# Patient Record
Sex: Male | Born: 1960 | Race: White | Hispanic: No | Marital: Married | Smoking: Current some day smoker
Health system: Southern US, Community
[De-identification: ages and names within clinical notes are randomized; demographics above are authoritative.]

## PROBLEM LIST (undated history)

## (undated) DIAGNOSIS — E119 Type 2 diabetes mellitus without complications: Secondary | ICD-10-CM

## (undated) DIAGNOSIS — I10 Essential (primary) hypertension: Secondary | ICD-10-CM

## (undated) HISTORY — PX: CARDIAC SURGERY: SHX584

---

## 2017-08-31 ENCOUNTER — Emergency Department (HOSPITAL_COMMUNITY): Payer: Worker's Compensation

## 2017-08-31 ENCOUNTER — Emergency Department (HOSPITAL_COMMUNITY)
Admission: EM | Admit: 2017-08-31 | Discharge: 2017-09-01 | Disposition: A | Discharge status: Home / Self Care | Payer: Worker's Compensation | Attending: Emergency Medicine | Admitting: Emergency Medicine

## 2017-08-31 ENCOUNTER — Encounter (HOSPITAL_COMMUNITY): Payer: Self-pay | Admitting: Emergency Medicine

## 2017-08-31 DIAGNOSIS — S62114A Nondisplaced fracture of triquetrum [cuneiform] bone, right wrist, initial encounter for closed fracture: Secondary | ICD-10-CM | POA: Diagnosis not present

## 2017-08-31 DIAGNOSIS — M7918 Myalgia, other site: Secondary | ICD-10-CM | POA: Insufficient documentation

## 2017-08-31 DIAGNOSIS — M25511 Pain in right shoulder: Secondary | ICD-10-CM

## 2017-08-31 DIAGNOSIS — J984 Other disorders of lung: Secondary | ICD-10-CM | POA: Diagnosis not present

## 2017-08-31 DIAGNOSIS — R51 Headache: Secondary | ICD-10-CM | POA: Insufficient documentation

## 2017-08-31 DIAGNOSIS — Y92410 Unspecified street and highway as the place of occurrence of the external cause: Secondary | ICD-10-CM | POA: Diagnosis not present

## 2017-08-31 DIAGNOSIS — Y9389 Activity, other specified: Secondary | ICD-10-CM | POA: Diagnosis not present

## 2017-08-31 DIAGNOSIS — I10 Essential (primary) hypertension: Secondary | ICD-10-CM | POA: Diagnosis not present

## 2017-08-31 DIAGNOSIS — K760 Fatty (change of) liver, not elsewhere classified: Secondary | ICD-10-CM | POA: Diagnosis not present

## 2017-08-31 DIAGNOSIS — S6991XA Unspecified injury of right wrist, hand and finger(s), initial encounter: Secondary | ICD-10-CM | POA: Diagnosis present

## 2017-08-31 DIAGNOSIS — M791 Myalgia, unspecified site: Secondary | ICD-10-CM

## 2017-08-31 DIAGNOSIS — E119 Type 2 diabetes mellitus without complications: Secondary | ICD-10-CM | POA: Diagnosis not present

## 2017-08-31 DIAGNOSIS — Y990 Civilian activity done for income or pay: Secondary | ICD-10-CM | POA: Insufficient documentation

## 2017-08-31 DIAGNOSIS — F17200 Nicotine dependence, unspecified, uncomplicated: Secondary | ICD-10-CM | POA: Diagnosis not present

## 2017-08-31 DIAGNOSIS — I2510 Atherosclerotic heart disease of native coronary artery without angina pectoris: Secondary | ICD-10-CM | POA: Insufficient documentation

## 2017-08-31 HISTORY — DX: Essential (primary) hypertension: I10

## 2017-08-31 HISTORY — DX: Type 2 diabetes mellitus without complications: E11.9

## 2017-08-31 LAB — CBC
HEMATOCRIT: 38.9 % — AB (ref 39.0–52.0)
Hemoglobin: 12.9 g/dL — ABNORMAL LOW (ref 13.0–17.0)
MCH: 29.4 pg (ref 26.0–34.0)
MCHC: 33.2 g/dL (ref 30.0–36.0)
MCV: 88.6 fL (ref 78.0–100.0)
Platelets: 141 10*3/uL — ABNORMAL LOW (ref 150–400)
RBC: 4.39 MIL/uL (ref 4.22–5.81)
RDW: 14.2 % (ref 11.5–15.5)
WBC: 5.1 10*3/uL (ref 4.0–10.5)

## 2017-08-31 LAB — I-STAT CHEM 8, ED
BUN: 6 mg/dL (ref 6–20)
CHLORIDE: 95 mmol/L — AB (ref 101–111)
CREATININE: 1.1 mg/dL (ref 0.61–1.24)
Calcium, Ion: 1.17 mmol/L (ref 1.15–1.40)
GLUCOSE: 100 mg/dL — AB (ref 65–99)
HCT: 41 % (ref 39.0–52.0)
Hemoglobin: 13.9 g/dL (ref 13.0–17.0)
POTASSIUM: 3.9 mmol/L (ref 3.5–5.1)
Sodium: 135 mmol/L (ref 135–145)
TCO2: 30 mmol/L (ref 22–32)

## 2017-08-31 MED ORDER — PROPOFOL 10 MG/ML IV BOLUS
INTRAVENOUS | Status: AC
  Filled 2017-08-31: qty 20

## 2017-08-31 MED ORDER — IOPAMIDOL (ISOVUE-300) INJECTION 61%
INTRAVENOUS | Status: AC
  Administered 2017-09-01: 100 mL
  Filled 2017-08-31: qty 100

## 2017-08-31 MED ORDER — ONDANSETRON HCL 4 MG/2ML IJ SOLN
4.0000 mg | Freq: Once | INTRAMUSCULAR | Status: AC
  Administered 2017-08-31: 4 mg via INTRAVENOUS
  Filled 2017-08-31: qty 2

## 2017-08-31 MED ORDER — MORPHINE SULFATE (PF) 4 MG/ML IV SOLN
4.0000 mg | Freq: Once | INTRAVENOUS | Status: AC
  Administered 2017-08-31: 4 mg via INTRAVENOUS
  Filled 2017-08-31: qty 1

## 2017-08-31 MED ORDER — PROPOFOL 10 MG/ML IV BOLUS: 0.5000 mg/kg | Freq: Once | INTRAVENOUS | Status: DC

## 2017-08-31 MED ORDER — HYDROMORPHONE HCL 1 MG/ML IJ SOLN
1.0000 mg | Freq: Once | INTRAMUSCULAR | Status: AC
  Administered 2017-08-31: 1 mg via INTRAVENOUS
  Filled 2017-08-31: qty 1
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
## 2017-09-01 ENCOUNTER — Emergency Department (HOSPITAL_COMMUNITY): Payer: Worker's Compensation

## 2017-09-01 LAB — COMPREHENSIVE METABOLIC PANEL
ALK PHOS: 95 U/L (ref 38–126)
ALT: 16 U/L — ABNORMAL LOW (ref 17–63)
ANION GAP: 8 (ref 5–15)
AST: 25 U/L (ref 15–41)
Albumin: 4.1 g/dL (ref 3.5–5.0)
BUN: 5 mg/dL — ABNORMAL LOW (ref 6–20)
CALCIUM: 9.2 mg/dL (ref 8.9–10.3)
CO2: 28 mmol/L (ref 22–32)
Chloride: 96 mmol/L — ABNORMAL LOW (ref 101–111)
Creatinine, Ser: 1.08 mg/dL (ref 0.61–1.24)
GFR calc non Af Amer: >60 mL/min (ref >60)
Glucose, Bld: 107 mg/dL — ABNORMAL HIGH (ref 65–99)
Potassium: 3.8 mmol/L (ref 3.5–5.1)
Sodium: 132 mmol/L — ABNORMAL LOW (ref 135–145)
TOTAL PROTEIN: 7.1 g/dL (ref 6.5–8.1)
Total Bilirubin: 0.7 mg/dL (ref 0.3–1.2)

## 2017-09-01 LAB — PROTIME-INR
INR: 0.96
Prothrombin Time: 12.7 seconds (ref 11.4–15.2)

## 2017-09-01 MED ORDER — IBUPROFEN 800 MG PO TABS: 800.0000 mg | ORAL_TABLET | Freq: Three times a day (TID) | ORAL | 0 refills | Status: AC

## 2017-09-01 MED ORDER — METHOCARBAMOL 500 MG PO TABS
500.0000 mg | ORAL_TABLET | Freq: Once | ORAL | Status: AC
Start: 2017-09-01 — End: 2017-09-01
  Administered 2017-09-01: 500 mg via ORAL
  Filled 2017-09-01: qty 1

## 2017-09-01 MED ORDER — HYDROMORPHONE HCL 1 MG/ML IJ SOLN
0.5000 mg | Freq: Once | INTRAMUSCULAR | Status: AC
  Administered 2017-09-01: 0.5 mg via INTRAVENOUS
  Filled 2017-09-01: qty 1

## 2017-09-01 MED ORDER — OXYCODONE-ACETAMINOPHEN 5-325 MG PO TABS: 1.0000 | ORAL_TABLET | Freq: Four times a day (QID) | ORAL | 0 refills | Status: AC | PRN

## 2017-09-01 MED ORDER — METHOCARBAMOL 500 MG PO TABS: 500.0000 mg | ORAL_TABLET | Freq: Two times a day (BID) | ORAL | 0 refills | Status: AC | PRN

## 2017-09-01 NOTE — ED Notes (Signed)
Pt. Back from CT.
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
## 2017-09-01 NOTE — Sedation Documentation (Signed)
Conscious sedation not warranted and canceled per EDP

## 2017-09-01 NOTE — Progress Notes (Signed)
Orthopedic Tech Progress Note Patient Details:  Johnathan BougieGeorge Snow [birth year] [id number]  Ortho Devices Type of Ortho Device: Volar splint, Arm sling Ortho Device/Splint Location: rue Ortho Device/Splint Interventions: Ordered, Application, Adjustment   Post Interventions Patient Tolerated: Well Instructions Provided: Care of device, Adjustment of device   Trinna PostMartinez, Donika Butner J 09/01/2017, 2:07 AM
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
## 2023-11-29 DEATH — deceased
